# Patient Record
Sex: Male | Born: 1970 | Race: Black or African American | Hispanic: No | Marital: Married | Smoking: Current some day smoker
Health system: Southern US, Community
[De-identification: ages and names within clinical notes are randomized; demographics above are authoritative.]

## PROBLEM LIST (undated history)

## (undated) DIAGNOSIS — I10 Essential (primary) hypertension: Secondary | ICD-10-CM

## (undated) DIAGNOSIS — N200 Calculus of kidney: Secondary | ICD-10-CM

---

## 2000-09-07 ENCOUNTER — Emergency Department (HOSPITAL_COMMUNITY): Admission: EM | Admit: 2000-09-07 | Discharge: 2000-09-07 | Payer: Self-pay | Admitting: Emergency Medicine

## 2000-09-07 ENCOUNTER — Encounter: Payer: Self-pay | Admitting: Emergency Medicine

## 2000-09-07 ENCOUNTER — Ambulatory Visit (HOSPITAL_COMMUNITY): Admission: RE | Admit: 2000-09-07 | Discharge: 2000-09-07 | Payer: Self-pay | Admitting: Urology

## 2000-09-07 ENCOUNTER — Encounter: Payer: Self-pay | Admitting: Urology

## 2011-08-31 ENCOUNTER — Encounter: Payer: Self-pay | Admitting: Emergency Medicine

## 2011-08-31 ENCOUNTER — Emergency Department
Admission: EM | Admit: 2011-08-31 | Discharge: 2011-08-31 | Disposition: A | Discharge status: Home / Self Care | Payer: Self-pay | Source: Home / Self Care

## 2011-08-31 DIAGNOSIS — T300 Burn of unspecified body region, unspecified degree: Secondary | ICD-10-CM

## 2011-08-31 DIAGNOSIS — J329 Chronic sinusitis, unspecified: Secondary | ICD-10-CM

## 2011-08-31 MED ORDER — SILVER SULFADIAZINE 1 % EX CREA: TOPICAL_CREAM | Freq: Every day | CUTANEOUS | Status: AC

## 2011-08-31 MED ORDER — METHYLPREDNISOLONE SODIUM SUCC 125 MG IJ SOLR
125.0000 mg | Freq: Once | INTRAMUSCULAR | Status: AC
  Administered 2011-08-31: 125 mg via INTRAMUSCULAR

## 2011-08-31 MED ORDER — AMOXICILLIN 500 MG PO CAPS: 1000.0000 mg | ORAL_CAPSULE | Freq: Three times a day (TID) | ORAL | Status: AC

## 2011-08-31 MED ORDER — FLUTICASONE PROPIONATE 50 MCG/ACT NA SUSP: 2.0000 | Freq: Every day | NASAL | Status: AC

## 2011-08-31 NOTE — ED Notes (Signed)
Sinus headache x 1 month, pressure over left eye
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
## 2011-09-02 NOTE — ED Provider Notes (Signed)
Agree with exam, assessment, and plan.   Lattie Haw, MD 09/02/11 [phone]

## 2019-05-16 ENCOUNTER — Ambulatory Visit: Payer: Self-pay | Attending: Internal Medicine

## 2019-05-16 ENCOUNTER — Ambulatory Visit: Payer: Self-pay

## 2019-05-16 DIAGNOSIS — Z23 Encounter for immunization: Secondary | ICD-10-CM

## 2019-05-16 NOTE — Progress Notes (Signed)
   Covid-19 Vaccination Clinic  Name:  Jeffrey Simon    MRN: [id number] DOB: 10-27-1970  05/16/2019  Jeffrey Simon was observed post Covid-19 immunization for 15 minutes without incident. He was provided with Vaccine Information Sheet and instruction to access the V-Safe system.   Jeffrey Simon was instructed to call 911 with any severe reactions post vaccine: Marland Kitchen Difficulty breathing  . Swelling of face and throat  . A fast heartbeat  . A bad rash all over body  . Dizziness and weakness   Immunizations Administered    Name Date Dose VIS Date Route   Pfizer COVID-19 Vaccine 05/16/2019  4:29 PM 0.3 mL 02/01/2019 Intramuscular   Manufacturer: [employer], [employer]   Lot: [id number]   NDC: [address]-4

## 2019-06-12 ENCOUNTER — Ambulatory Visit: Payer: Self-pay | Attending: Internal Medicine

## 2019-06-12 DIAGNOSIS — Z23 Encounter for immunization: Secondary | ICD-10-CM

## 2019-06-12 NOTE — Progress Notes (Signed)
   Covid-19 Vaccination Clinic  Name:  PARNELL SPIELER    MRN: [id number] DOB: [birth year]  06/12/2019  Mr. Allender was observed post Covid-19 immunization for 15 minutes without incident. He was provided with Vaccine Information Sheet and instruction to access the V-Safe system.   Mr. Mcclure was instructed to call 911 with any severe reactions post vaccine: Marland Kitchen Difficulty breathing  . Swelling of face and throat  . A fast heartbeat  . A bad rash all over body  . Dizziness and weakness   Immunizations Administered    Name Date Dose VIS Date Route   Pfizer COVID-19 Vaccine 06/12/2019  4:10 PM 0.3 mL 04/17/2018 Intramuscular   Manufacturer: [employer], [employer]   Lot: [id number]   NDC: [address]-4

## 2019-11-20 ENCOUNTER — Other Ambulatory Visit: Payer: Self-pay | Admitting: Gastroenterology

## 2019-11-20 DIAGNOSIS — R7989 Other specified abnormal findings of blood chemistry: Secondary | ICD-10-CM

## 2019-11-28 ENCOUNTER — Ambulatory Visit
Admission: RE | Admit: 2019-11-28 | Discharge: 2019-11-28 | Disposition: A | Discharge status: Home / Self Care | Payer: BC Managed Care – PPO | Source: Ambulatory Visit | Attending: Gastroenterology | Admitting: Gastroenterology

## 2019-11-28 DIAGNOSIS — R7989 Other specified abnormal findings of blood chemistry: Secondary | ICD-10-CM

## 2020-02-27 ENCOUNTER — Other Ambulatory Visit: Payer: BC Managed Care – PPO

## 2020-02-27 DIAGNOSIS — Z20822 Contact with and (suspected) exposure to covid-19: Secondary | ICD-10-CM

## 2020-02-29 LAB — NOVEL CORONAVIRUS, NAA: SARS-CoV-2, NAA: NOT DETECTED

## 2020-02-29 LAB — SARS-COV-2, NAA 2 DAY TAT

## 2020-12-23 ENCOUNTER — Emergency Department (HOSPITAL_BASED_OUTPATIENT_CLINIC_OR_DEPARTMENT_OTHER)
Admission: EM | Admit: 2020-12-23 | Discharge: 2020-12-23 | Disposition: A | Discharge status: Home / Self Care | Payer: BC Managed Care – PPO | Attending: Emergency Medicine | Admitting: Emergency Medicine

## 2020-12-23 ENCOUNTER — Encounter (HOSPITAL_BASED_OUTPATIENT_CLINIC_OR_DEPARTMENT_OTHER): Payer: Self-pay | Admitting: Emergency Medicine

## 2020-12-23 ENCOUNTER — Other Ambulatory Visit: Payer: Self-pay

## 2020-12-23 ENCOUNTER — Emergency Department (HOSPITAL_BASED_OUTPATIENT_CLINIC_OR_DEPARTMENT_OTHER): Payer: BC Managed Care – PPO

## 2020-12-23 DIAGNOSIS — I10 Essential (primary) hypertension: Secondary | ICD-10-CM | POA: Diagnosis not present

## 2020-12-23 DIAGNOSIS — R310 Gross hematuria: Secondary | ICD-10-CM

## 2020-12-23 DIAGNOSIS — R39198 Other difficulties with micturition: Secondary | ICD-10-CM | POA: Insufficient documentation

## 2020-12-23 DIAGNOSIS — F17290 Nicotine dependence, other tobacco product, uncomplicated: Secondary | ICD-10-CM | POA: Insufficient documentation

## 2020-12-23 DIAGNOSIS — R319 Hematuria, unspecified: Secondary | ICD-10-CM | POA: Diagnosis present

## 2020-12-23 HISTORY — DX: Calculus of kidney: N20.0

## 2020-12-23 HISTORY — DX: Essential (primary) hypertension: I10

## 2020-12-23 LAB — URINALYSIS, MICROSCOPIC (REFLEX): RBC / HPF: >50 RBC/hpf (ref 0–5)

## 2020-12-23 LAB — URINALYSIS, ROUTINE W REFLEX MICROSCOPIC
Glucose, UA: NEGATIVE mg/dL
Ketones, ur: NEGATIVE mg/dL
Leukocytes,Ua: NEGATIVE
Nitrite: NEGATIVE
Protein, ur: 100 mg/dL — AB
Specific Gravity, Urine: >=1.03 (ref 1.005–1.030)
pH: 5.5 (ref 5.0–8.0)

## 2020-12-23 NOTE — ED Notes (Signed)
Pt escorted to discharge window. Verbalized understanding discharge instructions. In no acute distress.   

## 2020-12-23 NOTE — ED Provider Notes (Signed)
[facility] Provider Note: Georgena Spurling, MD, FACEP  CSN: [id number] MRN: [id number] ARRIVAL: 12/23/20 at [place]: [place]  Hematuria   HISTORY OF PRESENT ILLNESS  12/23/20 6:09 AM Jeffrey Simon is a 50 y.o. male who had sexual intercourse yesterday evening about 10 PM.  This morning about 2 or 3 AM he went to urinate and noticed that there was gross hematuria.  He also noticed that he was having difficulty emptying his bladder.  He has continued to have difficulty emptying his bladder and feels his bladder is distended.  He rates associated discomfort as a 3 out of 10, worse with palpation of the bladder.  He denies any fever, chills, back pain or flank pain.  He does have a history of kidney stones.  He denies any recent burning with urination.  He had a similar occurrence about 3 weeks ago, also after intercourse.  He was able to relieve the sensation of his bladder being distended by forcefully urinating out what he believes was a clot.   Past Medical History:  Diagnosis Date   Hypertension    Kidney stone     History reviewed. No pertinent surgical history.  Family History  Problem Relation Age of Onset   Diabetes Mother     Social History   Tobacco Use   Smoking status: Some Days    Types: Cigars   Smokeless tobacco: Never  Vaping Use   Vaping Use: Never used  Substance Use Topics   Alcohol use: Yes   Drug use: Not Currently    Prior to Admission medications   Medication Sig Start Date End Date Taking? Authorizing Provider  butalbital-acetaminophen-caffeine (FIORICET, ESGIC) 50-325-40 MG per tablet Take 1 tablet by mouth 2 (two) times daily as needed.    [provider]  fluticasone (FLONASE) 50 MCG/ACT nasal spray Place 2 sprays into the nose daily. 08/31/11 08/30/12  Deneise Lever, MD  metoCLOPramide (REGLAN) 10 MG tablet Take 10 mg by mouth 4 (four) times daily.    [provider]     Allergies Patient has no known allergies.   REVIEW OF SYSTEMS  Negative except as noted here or in the History of Present Illness.   PHYSICAL EXAMINATION  Initial Vital Signs Blood pressure (!) 139/92, pulse 87, temperature 98.4 F (36.9 C), temperature source Oral, resp. rate 18, height 5\' 7"  (1.702 m), weight 122.5 kg, SpO2 98 %.  Examination General: Well-developed, well-nourished male in no acute distress; appearance consistent with age of record HENT: normocephalic; atraumatic Eyes: Normal appearance Neck: supple Heart: regular rate and rhythm Lungs: clear to auscultation bilaterally Abdomen: soft; distended, mildly tender bladder; bowel sounds present GU: Grossly bloody urine Extremities: No deformity; full range of motion Neurologic: Awake, alert and oriented; motor function intact in all extremities and symmetric; no facial droop Skin: Warm and dry Psychiatric: Normal mood and affect   RESULTS  Summary of this visit's results, reviewed and interpreted by myself:   EKG Interpretation  Date/Time:    Ventricular Rate:    PR Interval:    QRS Duration:   QT Interval:    QTC Calculation:   R Axis:     Text Interpretation:         Laboratory Studies: Results for orders placed or performed during the hospital encounter of 12/23/20 (from the past 24 hour(s))  Urinalysis, Routine w reflex microscopic Urine, Clean Catch     Status: Abnormal   Collection Time:  12/23/20  6:09 AM  Result Value Ref Range   Color, Urine BROWN (A) YELLOW   APPearance CLOUDY (A) CLEAR   Specific Gravity, Urine >=1.030 1.005 - 1.030   pH 5.5 5.0 - 8.0   Glucose, UA NEGATIVE NEGATIVE mg/dL   Hgb urine dipstick LARGE (A) NEGATIVE   Bilirubin Urine SMALL (A) NEGATIVE   Ketones, ur NEGATIVE NEGATIVE mg/dL   Protein, ur 100 (A) NEGATIVE mg/dL   Nitrite NEGATIVE NEGATIVE   Leukocytes,Ua NEGATIVE NEGATIVE  Urinalysis, Microscopic (reflex)     Status: Abnormal   Collection Time:  12/23/20  6:09 AM  Result Value Ref Range   RBC / HPF >50 0 - 5 RBC/hpf   WBC, UA 0-5 0 - 5 WBC/hpf   Bacteria, UA RARE (A) NONE SEEN   Squamous Epithelial / LPF 0-5 0 - 5   Mucus PRESENT    Hyaline Casts, UA PRESENT    Imaging Studies: CT Renal Stone Study  Result Date: 12/23/2020 CLINICAL DATA:  50 year old male with flank pain. Gross hematuria following intercourse. Dysuria. History of kidney stones in 2002. EXAM: CT ABDOMEN AND PELVIS WITHOUT CONTRAST TECHNIQUE: Multidetector CT imaging of the abdomen and pelvis was performed following the standard protocol without IV contrast. COMPARISON:  Report of CT Abdomen and Pelvis 09/07/2000 (no images available). FINDINGS: Lower chest: Negative. Hepatobiliary: Negative noncontrast liver and gallbladder. Pancreas: Negative. Spleen: Negative.  Small splenule, normal variant. Adrenals/Urinary Tract: Normal adrenal glands. Punctate left nephrolithiasis in the lower pole. No left hydronephrosis. The left ureter is normal to the bladder. Contralateral bulky 11 mm calculus is within the midpole of a mild congenitally rotated right kidney (normal variant series 2, image 38). But no right hydronephrosis. No pararenal inflammation. Right ureter appears within normal limits to the bladder. Unremarkable bladder. Incidental pelvic phleboliths. Stomach/Bowel: Redundant large bowel, the sigmoid colon tracks into the left epigastrium. Intermittent retained stool. Diverticulosis most pronounced at the hepatic flexure. No large bowel inflammation identified. Normal appendix on coronal image 58. Negative terminal ileum. No dilated small bowel. Decompressed stomach and duodenum. No free air or free fluid. Vascular/Lymphatic: Normal caliber abdominal aorta. Minimal calcified iliac artery atherosclerosis. No lymphadenopathy identified. Reproductive: Negative. Other: No pelvic free fluid. Musculoskeletal: Lower thoracic flowing endplate osteophytes resulting in interbody  ankylosis. Advanced chronic disc and endplate degeneration at the lumbosacral junction. Chronic right SI joint degeneration with bulky osteophytosis. Chronic pubic symphysis degeneration. No acute osseous abnormality identified. IMPRESSION: 1. Bilateral nephrolithiasis, including a bulky 11 mm right renal midpole calculus. But no obstructive uropathy. 2. No acute or inflammatory process in the non-contrast abdomen or pelvis. Normal appendix. Redundant large bowel with mild diverticulosis. Intermittent ankylosis and degeneration in the spine. Electronically Signed   By: Genevie Ann M.D.   On: 12/23/2020 06:51    ED COURSE and MDM  Nursing notes, initial and subsequent vitals signs, including pulse oximetry, reviewed and interpreted by myself.  Vitals:   12/23/20 0554  BP: (!) 139/92  Pulse: 87  Resp: 18  Temp: 98.4 F (36.9 C)  TempSrc: Oral  SpO2: 98%  Weight: 122.5 kg  Height: 5\' 7"  (1.702 m)   Medications - No data to display  6:18 AM Patient was able to forcefully "pop" loose his obstruction and voided about 200 mL of grossly bloody urine.  His bladder is now nondistended and nontender.  Urine has been sent for urinalysis and culture.  7:08 AM This is a second time the patient has had this occur in a  month.  I suspect a bladder or prostate lesion causing the bleeding.  There is no obvious ureteral stone or obstruction to explain this.  An infection is also less likely.  Urine has been sent for culture.  We will refer patient to urology as we cannot exclude a bladder or prostate process, differential diagnosis includes cancer and the patient was advised to this.  PROCEDURES  Procedures   ED DIAGNOSES     ICD-10-CM   1. Gross hematuria  R31.0          Holley Wirt, Jonny Ruiz, MD 12/23/20 [phone]

## 2020-12-23 NOTE — ED Triage Notes (Signed)
Patient arrived via POV c/o hematuria post intercourse. Patient states some feling of retention. Patient states increased difficulty urinating at this time. Patient states this is second episode, last time being approximately 1 month prior. Patient is AO x 4, VS WDL, normal gait.

## 2020-12-24 LAB — URINE CULTURE: Culture: NO GROWTH

## 2020-12-28 ENCOUNTER — Other Ambulatory Visit: Payer: Self-pay | Admitting: Urology

## 2020-12-28 DIAGNOSIS — N200 Calculus of kidney: Secondary | ICD-10-CM

## 2021-01-01 NOTE — Progress Notes (Signed)
Talked with patient. Instructions given arrival time 0600. Cl liquids until 0400. Take B/P am of procedure with sip of water. Will bring in name of B/P med and cholesterol med to add to chart. Wife is the driver. Went over smoking and ETOH to hold . Meds and hx reviewed

## 2021-01-04 ENCOUNTER — Ambulatory Visit (HOSPITAL_BASED_OUTPATIENT_CLINIC_OR_DEPARTMENT_OTHER)
Admission: RE | Admit: 2021-01-04 | Discharge: 2021-01-04 | Disposition: A | Discharge status: Home / Self Care | Payer: BC Managed Care – PPO | Source: Ambulatory Visit | Attending: Urology | Admitting: Urology

## 2021-01-04 ENCOUNTER — Other Ambulatory Visit: Payer: Self-pay

## 2021-01-04 ENCOUNTER — Encounter (HOSPITAL_BASED_OUTPATIENT_CLINIC_OR_DEPARTMENT_OTHER)
Admission: RE | Disposition: A | Discharge status: Home / Self Care | Payer: Self-pay | Source: Ambulatory Visit | Attending: Urology

## 2021-01-04 ENCOUNTER — Ambulatory Visit (HOSPITAL_COMMUNITY): Payer: BC Managed Care – PPO

## 2021-01-04 ENCOUNTER — Encounter (HOSPITAL_BASED_OUTPATIENT_CLINIC_OR_DEPARTMENT_OTHER): Payer: Self-pay | Admitting: Urology

## 2021-01-04 DIAGNOSIS — N200 Calculus of kidney: Secondary | ICD-10-CM | POA: Insufficient documentation

## 2021-01-04 HISTORY — PX: EXTRACORPOREAL SHOCK WAVE LITHOTRIPSY: SHX1557

## 2021-01-04 SURGERY — LITHOTRIPSY, ESWL
Anesthesia: LOCAL | Laterality: Right

## 2021-01-04 MED ORDER — CIPROFLOXACIN HCL 500 MG PO TABS
500.0000 mg | ORAL_TABLET | ORAL | Status: AC
  Administered 2021-01-04: 500 mg via ORAL

## 2021-01-04 MED ORDER — DIPHENHYDRAMINE HCL 25 MG PO CAPS
ORAL_CAPSULE | ORAL | Status: AC
  Filled 2021-01-04: qty 1

## 2021-01-04 MED ORDER — DIAZEPAM 5 MG PO TABS
10.0000 mg | ORAL_TABLET | ORAL | Status: AC
  Administered 2021-01-04: 10 mg via ORAL

## 2021-01-04 MED ORDER — HYDROCODONE-ACETAMINOPHEN 5-325 MG PO TABS: 1.0000 | ORAL_TABLET | ORAL | 0 refills | Status: AC | PRN

## 2021-01-04 MED ORDER — DIAZEPAM 5 MG PO TABS
ORAL_TABLET | ORAL | Status: AC
  Filled 2021-01-04: qty 2

## 2021-01-04 MED ORDER — DIPHENHYDRAMINE HCL 25 MG PO CAPS
25.0000 mg | ORAL_CAPSULE | ORAL | Status: AC
  Administered 2021-01-04: 25 mg via ORAL

## 2021-01-04 MED ORDER — CIPROFLOXACIN HCL 500 MG PO TABS
ORAL_TABLET | ORAL | Status: AC
  Filled 2021-01-04: qty 1

## 2021-01-04 MED ORDER — TAMSULOSIN HCL 0.4 MG PO CAPS: 0.4000 mg | ORAL_CAPSULE | Freq: Every day | ORAL | 0 refills | Status: AC

## 2021-01-04 MED ORDER — SODIUM CHLORIDE 0.9 % IV SOLN: INTRAVENOUS | Status: DC

## 2021-01-04 NOTE — H&P (Signed)
See HP scanned into epic Right renal calculus

## 2021-01-04 NOTE — Op Note (Signed)
See [facility] OP note scanned into chart. Also because of the size, density, location and other factors that cannot be anticipated I feel this will likely be a staged procedure. This fact supersedes any indication in the scanned [facility] operative note to the contrary.  

## 2021-01-04 NOTE — Discharge Instructions (Addendum)
See [facility] discharge instructions in chart.    Post Anesthesia Home Care Instructions  Activity: Get plenty of rest for the remainder of the day. A responsible individual must stay with you for 24 hours following the procedure.  For the next 24 hours, DO NOT: -Drive a car -Operate machinery -Drink alcoholic beverages -Take any medication unless instructed by your physician -Make any legal decisions or sign important papers.  Meals: Start with liquid foods such as gelatin or soup. Progress to regular foods as tolerated. Avoid greasy, spicy, heavy foods. If nausea and/or vomiting occur, drink only clear liquids until the nausea and/or vomiting subsides. Call your physician if vomiting continues.      

## 2021-01-05 ENCOUNTER — Encounter (HOSPITAL_BASED_OUTPATIENT_CLINIC_OR_DEPARTMENT_OTHER): Payer: Self-pay | Admitting: Urology

## 2021-01-11 NOTE — H&P (Signed)
See HP scanned into epic

## 2022-05-11 IMAGING — DX DG ABDOMEN 1V
2 series · 2 of 2 positions shown · non-contrast
Comparison: 12/24/2020

CLINICAL DATA: Nephrolithiasis

EXAM:
ABDOMEN - 1 VIEW

[abdomen kub (1 of 2)]
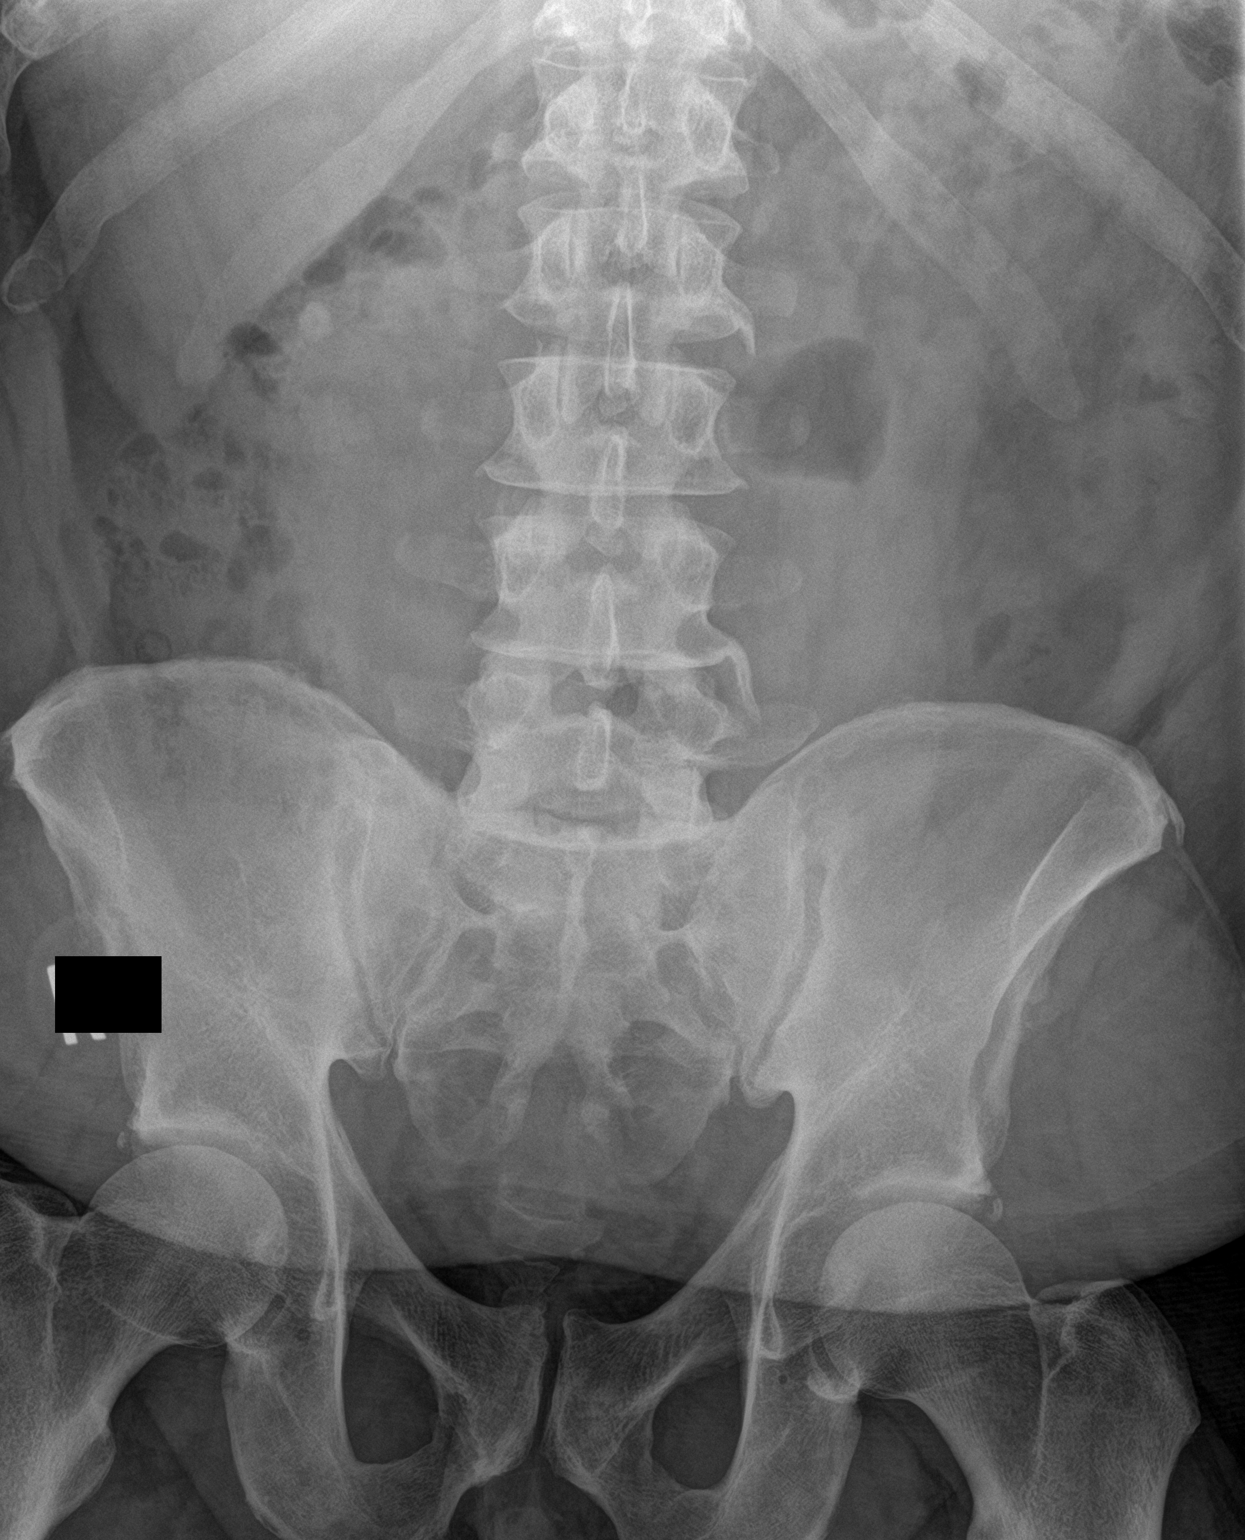

[abdomen kub (2 of 2)]
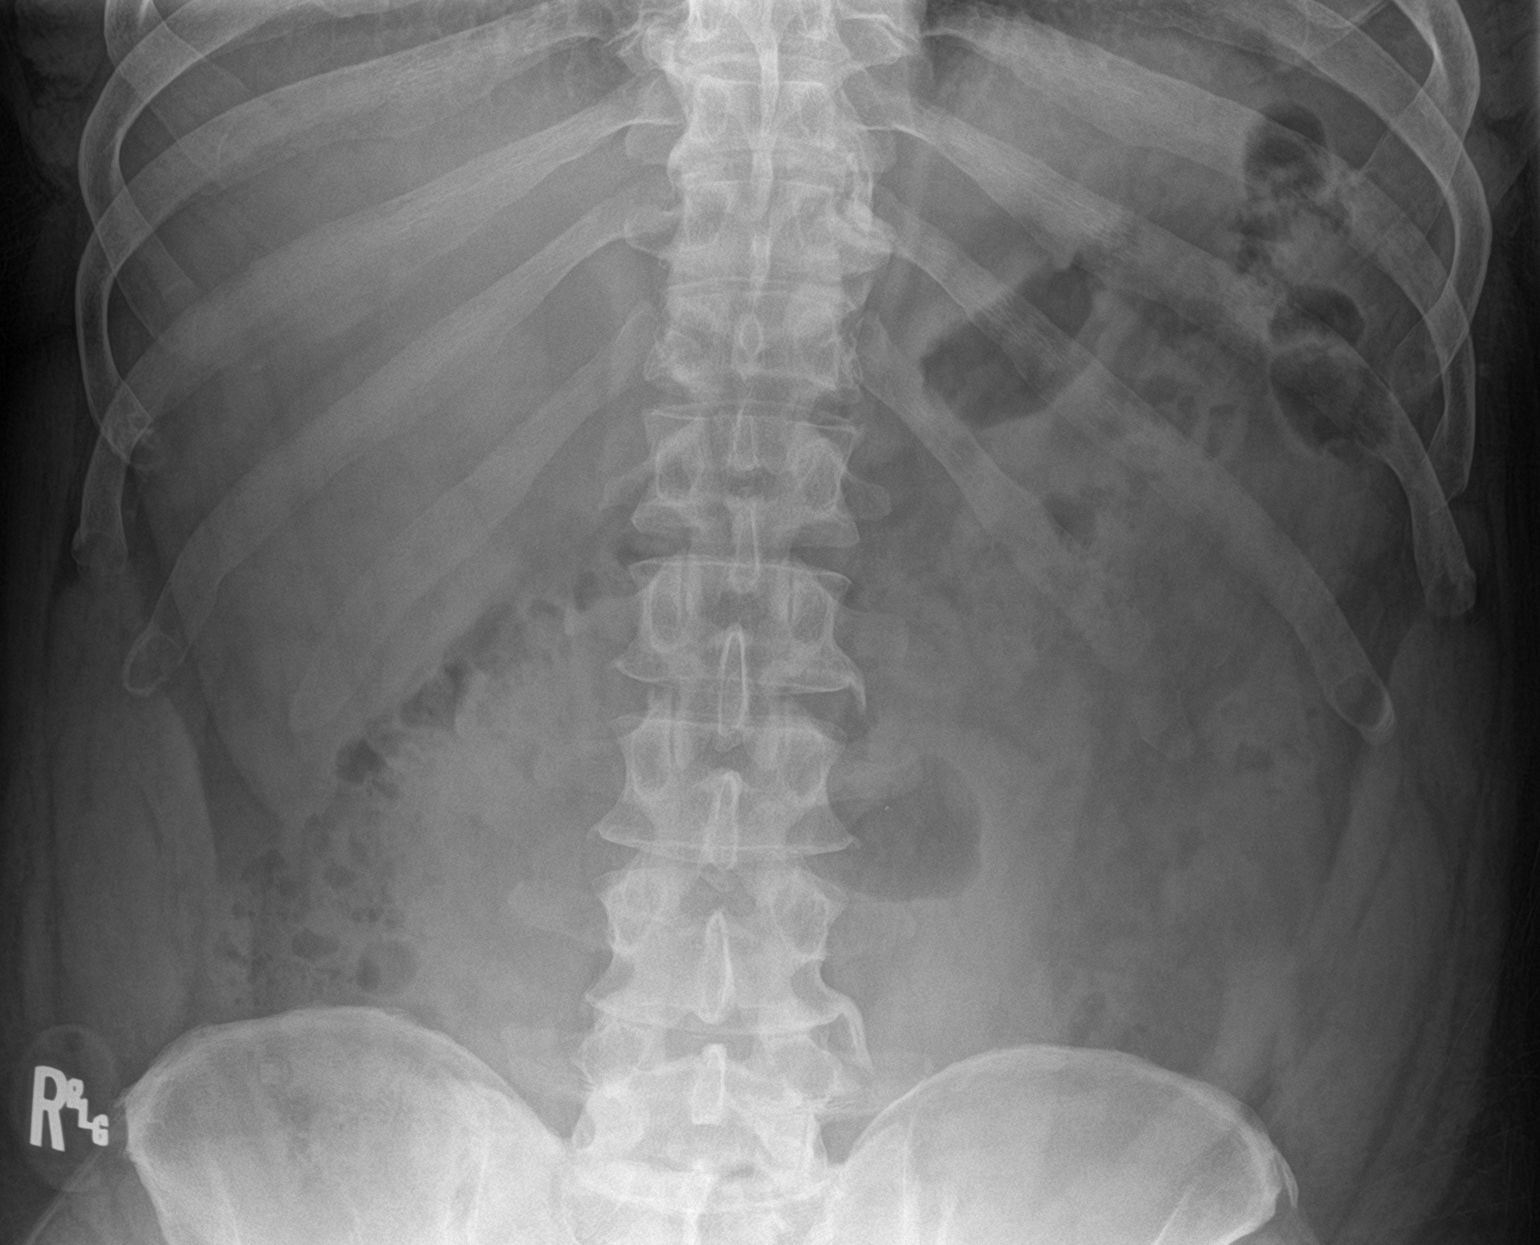

[2 of 2 positions shown; findings below may reference images not displayed]

FINDINGS: Stable 10 mm calculus overlies the lower pole of the right kidney.
No additional nephro or urolithiasis. Normal abdominal gas pattern.
No organomegaly.
IMPRESSION: Stable 10 mm right lower pole renal calculus.

## 2024-03-21 ENCOUNTER — Emergency Department (HOSPITAL_BASED_OUTPATIENT_CLINIC_OR_DEPARTMENT_OTHER)
Admission: EM | Admit: 2024-03-21 | Discharge: 2024-03-21 | Disposition: A | Discharge status: Home / Self Care | Attending: Emergency Medicine | Admitting: Emergency Medicine

## 2024-03-21 ENCOUNTER — Other Ambulatory Visit (HOSPITAL_BASED_OUTPATIENT_CLINIC_OR_DEPARTMENT_OTHER): Payer: Self-pay

## 2024-03-21 ENCOUNTER — Encounter (HOSPITAL_BASED_OUTPATIENT_CLINIC_OR_DEPARTMENT_OTHER): Payer: Self-pay

## 2024-03-21 ENCOUNTER — Emergency Department (HOSPITAL_BASED_OUTPATIENT_CLINIC_OR_DEPARTMENT_OTHER)

## 2024-03-21 ENCOUNTER — Other Ambulatory Visit: Payer: Self-pay

## 2024-03-21 DIAGNOSIS — Z7982 Long term (current) use of aspirin: Secondary | ICD-10-CM | POA: Diagnosis not present

## 2024-03-21 DIAGNOSIS — W000XXA Fall on same level due to ice and snow, initial encounter: Secondary | ICD-10-CM | POA: Diagnosis not present

## 2024-03-21 DIAGNOSIS — S298XXA Other specified injuries of thorax, initial encounter: Secondary | ICD-10-CM

## 2024-03-21 DIAGNOSIS — S20211A Contusion of right front wall of thorax, initial encounter: Secondary | ICD-10-CM | POA: Insufficient documentation

## 2024-03-21 MED ORDER — KETOROLAC TROMETHAMINE 60 MG/2ML IM SOLN
60.0000 mg | Freq: Once | INTRAMUSCULAR | Status: AC
  Administered 2024-03-21: 60 mg via INTRAMUSCULAR
  Filled 2024-03-21: qty 2

## 2024-03-21 MED ORDER — CYCLOBENZAPRINE HCL 10 MG PO TABS
10.0000 mg | ORAL_TABLET | Freq: Two times a day (BID) | ORAL | 0 refills | Status: AC | PRN
  Filled 2024-03-21: qty 20, 10d supply, fill #0

## 2024-03-21 MED ORDER — CYCLOBENZAPRINE HCL 10 MG PO TABS: 10.0000 mg | ORAL_TABLET | Freq: Two times a day (BID) | ORAL | 0 refills | Status: DC | PRN

## 2024-03-21 MED ORDER — LIDOCAINE 5 % EX PTCH
1.0000 | MEDICATED_PATCH | CUTANEOUS | 0 refills | Status: AC
  Filled 2024-03-21: qty 30, 30d supply, fill #0

## 2024-03-21 MED ORDER — LIDOCAINE 5 % EX PTCH
1.0000 | MEDICATED_PATCH | CUTANEOUS | Status: DC
  Administered 2024-03-21: 1 via TRANSDERMAL
  Filled 2024-03-21: qty 1

## 2024-03-21 MED ORDER — LIDOCAINE 5 % EX PTCH: 1.0000 | MEDICATED_PATCH | CUTANEOUS | 0 refills | Status: DC

## 2024-03-21 NOTE — ED Provider Notes (Signed)
 " Jeffrey Jeffrey Simon [facility] AT [facility] Provider Note   CSN: [id number] Arrival date & time: 03/21/24  9065     Patient presents with: Jeffrey Jeffrey Simon Jeffrey Simon Jeffrey Jeffrey Simon Jeffrey Jeffrey Simon is a 54 y.o. male.   Patient here after fall on ice yesterday hit the right side of his ribs.  Movement makes it worse.  Rest makes it better.  Denies any major difficulty breathing.  No pain elsewhere.  Not on blood thinners.  Did not hit his head.  No head or neck pain.  No extremity pain.  Has been ambulatory since the fall.  The history is provided by the patient.       Prior to Admission medications  Medication Sig Start Date End Date Taking? Authorizing Provider  aspirin EC 81 MG tablet Take 81 mg by mouth daily. Swallow whole.    [provider]  atorvastatin (LIPITOR) 40 MG tablet Take 40 mg by mouth daily.    [provider]  cyclobenzaprine  (FLEXERIL ) 10 MG tablet Take 1 tablet (10 mg total) by mouth 2 (two) times daily as needed for muscle spasms. 03/21/24   Valon Glasscock, DO  fluticasone  (FLONASE ) 50 MCG/ACT nasal spray Place 2 sprays into the nose daily. 08/31/11 08/30/12  Eldonna Elspeth PARAS, MD  lidocaine  (LIDODERM ) 5 % Place 1 patch onto the skin daily. Remove & Discard patch within 12 hours or as directed by MD 03/21/24   Ruthe Cornet, DO  lisinopril-hydrochlorothiazide (ZESTORETIC) 20-25 MG tablet Take 1 tablet by mouth daily.    [provider]  metoCLOPramide (REGLAN) 10 MG tablet Take 10 mg by mouth 4 (four) times daily.    [provider]  tamsulosin  (FLOMAX ) 0.4 MG CAPS capsule Take 1 capsule (0.4 mg total) by mouth at bedtime. 01/04/21   Pace, Maryellen D, MD    Allergies: Patient has no known allergies.    Review of Systems  Updated Vital Signs BP 120/73 (BP Location: Right Arm)   Pulse 83   Temp 97.8 F (36.6 C) (Oral)   Resp 16   Wt 128.4 kg   SpO2 98%   BMI 44.32 kg/m   Physical Exam Vitals and nursing note reviewed.  Constitutional:       General: He is not in acute distress.    Appearance: He is well-developed. He is not ill-appearing.  HENT:     Head: Normocephalic and atraumatic.     Nose: Nose normal.     Mouth/Throat:     Mouth: Mucous membranes are moist.  Eyes:     Extraocular Movements: Extraocular movements intact.     Conjunctiva/sclera: Conjunctivae normal.     Pupils: Pupils are equal, round, and reactive to light.  Cardiovascular:     Rate and Rhythm: Normal rate and regular rhythm.     Pulses: Normal pulses.     Heart sounds: Normal heart sounds. No murmur heard. Pulmonary:     Effort: Pulmonary effort is normal. No respiratory distress.     Breath sounds: Normal breath sounds.  Abdominal:     Palpations: Abdomen is soft.     Tenderness: There is no abdominal tenderness.  Musculoskeletal:        General: Tenderness present. No swelling.     Cervical back: Normal range of motion and neck supple. No tenderness.     Comments: Tenderness to the right anterior ribs  Skin:    General: Skin is warm and dry.     Capillary Refill: Capillary refill takes  less than 2 seconds.  Neurological:     General: No focal deficit present.     Mental Status: He is alert and oriented to person, place, and time.     Cranial Nerves: No cranial nerve deficit.     Sensory: No sensory deficit.     Motor: No weakness.     Coordination: Coordination normal.  Psychiatric:        Mood and Affect: Mood normal.     (all labs ordered are listed, but only abnormal results are displayed) Labs Reviewed - No data to display  EKG: EKG Interpretation Date/Time:  Thursday March 21 2024 09:58:42 EST Ventricular Rate:  86 PR Interval:  140 QRS Duration:  82 QT Interval:  324 QTC Calculation: 388 R Axis:   48  Text Interpretation: Sinus rhythm Confirmed by Ruthe Cornet [phone]) on 03/21/2024 10:00:12 AM  Radiology: [facility] Ribs Unilateral W/Chest Right Result Date: 03/21/2024 EXAM: 1 VIEW(S) XRAY OF THE RIGHT RIBS AND  CHEST 03/21/2024 10:19:00 AM COMPARISON: None available. CLINICAL HISTORY: Fall, right flank pain. FINDINGS: BONES: Thoracic spondylosis. No displaced rib fracture identified. LUNGS AND PLEURA: No consolidation or pulmonary edema. No pleural effusion or pneumothorax. HEART AND MEDIASTINUM: No acute abnormality of the cardiac and mediastinal silhouettes. IMPRESSION: 1. No displaced rib fracture. 2. Thoracic spondylosis. Electronically signed by: Ryan Salvage MD 03/21/2024 11:14 AM EST RP Workstation: [id number]     Procedures   Medications Ordered in the ED  lidocaine  (LIDODERM ) 5 % 1 patch (1 patch Transdermal Patch Applied 03/21/24 1048)  ketorolac  (TORADOL ) injection 60 mg (60 mg Intramuscular Given 03/21/24 1043)                                    Medical Decision Making Amount and/or Complexity of Data Reviewed Radiology: ordered.  Risk Prescription drug management.   Jeffrey Simon Jeffrey Jeffrey Simon Jeffrey Jeffrey Simon Jeffrey Simon is here with right-sided rib pain after fall yesterday.  Did not hit his head.  No headache no neck pain.  No midline spinal pain.  Tenderness to the anterior right ribs.  X-ray shows no pneumothorax per my review and interpretation.  Do not see any obvious rib fractures.  I suspect rib contusion.  Given Toradol  lidocaine  in the ED.  Recommend the same at home with lidocaine  patches, Tylenol  ibuprofen ice rest.  No other concern for any traumatic injuries.  Discharged in good condition.  Understands turn precautions.  This chart was dictated using voice recognition software.  Despite best efforts to proofread,  errors can occur which can change the documentation meaning.      Final diagnoses:  Contusion of rib, initial encounter    ED Discharge Orders          Ordered    cyclobenzaprine  (FLEXERIL ) 10 MG tablet  2 times daily PRN,   Status:  Discontinued        03/21/24 1041    lidocaine  (LIDODERM ) 5 %  Every 24 hours,   Status:  Discontinued        03/21/24 1041    cyclobenzaprine   (FLEXERIL ) 10 MG tablet  2 times daily PRN        03/21/24 1041    lidocaine  (LIDODERM ) 5 %  Every 24 hours        03/21/24 1041               Ruthe Cornet, DO 03/21/24 1117  "

## 2024-03-21 NOTE — ED Notes (Signed)
 Reviewed discharge instructions , follow up and medications with pt  Pt states understanding. Pt ambulatory at discharge with family.

## 2024-03-21 NOTE — ED Triage Notes (Addendum)
 Pt reports slipping in ice yesterday and landed on right side. Complaining of right chest wall hurting, only when he moves certain ways. No pain with palpation Denes SOB. No LOC or thinners

## 2024-03-21 NOTE — Discharge Instructions (Addendum)
 No obvious rib fractures on your x-rays today but do suspect you have rib bruise/contusion.  Recommend 1000 mg of Tylenol  every 6 hours as needed for pain, recommend 800 mg ibuprofen every 8 hours as needed for pain, consider buying lidocaine  patches over-the-counter to use daily if prescription patches are too expensive.  I have also prescribed you a muscle relaxant called cyclobenzaprine  to take as needed.  This medication is sedating so please be careful with its use.  Do not mix alcohol or dangerous activities.
